# Patient Record
Sex: Male | Born: 1970 | Race: Black or African American | Hispanic: No | Smoking: Current every day smoker
Health system: Southern US, Community
[De-identification: ages and names within clinical notes are randomized; demographics above are authoritative.]

## PROBLEM LIST (undated history)

## (undated) DIAGNOSIS — E119 Type 2 diabetes mellitus without complications: Secondary | ICD-10-CM

## (undated) HISTORY — PX: CHOLECYSTECTOMY: SHX55

---

## 2005-06-10 ENCOUNTER — Ambulatory Visit: Payer: Self-pay | Admitting: Family Medicine

## 2006-12-04 ENCOUNTER — Ambulatory Visit: Payer: Self-pay | Admitting: Internal Medicine

## 2006-12-21 ENCOUNTER — Ambulatory Visit: Payer: Self-pay | Admitting: General Surgery

## 2007-07-06 ENCOUNTER — Inpatient Hospital Stay: Payer: Self-pay | Admitting: Surgery

## 2007-07-17 ENCOUNTER — Ambulatory Visit: Payer: Self-pay | Admitting: Surgery

## 2007-08-29 ENCOUNTER — Ambulatory Visit: Payer: Self-pay | Admitting: Family Medicine

## 2007-08-30 ENCOUNTER — Encounter: Payer: Self-pay | Admitting: Orthopedic Surgery

## 2007-09-01 ENCOUNTER — Encounter: Payer: Self-pay | Admitting: Orthopedic Surgery

## 2010-05-26 ENCOUNTER — Emergency Department: Payer: Self-pay | Admitting: Emergency Medicine

## 2014-02-17 ENCOUNTER — Emergency Department: Payer: Self-pay | Admitting: Emergency Medicine

## 2014-04-29 ENCOUNTER — Emergency Department: Payer: Self-pay | Admitting: Emergency Medicine

## 2014-05-22 ENCOUNTER — Emergency Department
Admit: 2014-05-22 | Disposition: A | Discharge status: Home / Self Care | Payer: Self-pay | Admitting: Emergency Medicine

## 2014-06-29 ENCOUNTER — Other Ambulatory Visit: Payer: Self-pay

## 2014-06-29 ENCOUNTER — Encounter: Payer: Self-pay | Admitting: Emergency Medicine

## 2014-06-29 ENCOUNTER — Emergency Department: Payer: Self-pay

## 2014-06-29 ENCOUNTER — Emergency Department
Admission: EM | Admit: 2014-06-29 | Discharge: 2014-06-29 | Disposition: A | Discharge status: Home / Self Care | Payer: Self-pay | Attending: Emergency Medicine | Admitting: Emergency Medicine

## 2014-06-29 DIAGNOSIS — R079 Chest pain, unspecified: Secondary | ICD-10-CM | POA: Insufficient documentation

## 2014-06-29 DIAGNOSIS — Z720 Tobacco use: Secondary | ICD-10-CM | POA: Insufficient documentation

## 2014-06-29 DIAGNOSIS — E119 Type 2 diabetes mellitus without complications: Secondary | ICD-10-CM | POA: Insufficient documentation

## 2014-06-29 HISTORY — DX: Type 2 diabetes mellitus without complications: E11.9

## 2014-06-29 LAB — BASIC METABOLIC PANEL
Anion gap: 9 (ref 5–15)
BUN: 10 mg/dL (ref 6–20)
CALCIUM: 9 mg/dL (ref 8.9–10.3)
CHLORIDE: 99 mmol/L — AB (ref 101–111)
CO2: 28 mmol/L (ref 22–32)
Creatinine, Ser: 0.81 mg/dL (ref 0.61–1.24)
GFR calc Af Amer: >60 mL/min (ref >60)
GLUCOSE: 360 mg/dL — AB (ref 65–99)
Potassium: 3.8 mmol/L (ref 3.5–5.1)
Sodium: 136 mmol/L (ref 135–145)

## 2014-06-29 LAB — CBC
HCT: 45.7 % (ref 40.0–52.0)
Hemoglobin: 15.7 g/dL (ref 13.0–18.0)
MCH: 33.3 pg (ref 26.0–34.0)
MCHC: 34.4 g/dL (ref 32.0–36.0)
MCV: 96.8 fL (ref 80.0–100.0)
Platelets: 168 10*3/uL (ref 150–440)
RBC: 4.72 MIL/uL (ref 4.40–5.90)
RDW: 12 % (ref 11.5–14.5)
WBC: 6.7 10*3/uL (ref 3.8–10.6)

## 2014-06-29 LAB — TROPONIN I: Troponin I: <0.03 ng/mL (ref <0.031)

## 2014-06-29 MED ORDER — ASPIRIN 81 MG PO CHEW
324.0000 mg | CHEWABLE_TABLET | Freq: Once | ORAL | Status: AC
  Administered 2014-06-29: 324 mg via ORAL

## 2014-06-29 MED ORDER — ASPIRIN 81 MG PO CHEW
CHEWABLE_TABLET | ORAL | Status: AC
  Administered 2014-06-29: 324 mg via ORAL
  Filled 2014-06-29: qty 4

## 2014-06-29 MED ORDER — NITROGLYCERIN 0.4 MG SL SUBL
SUBLINGUAL_TABLET | SUBLINGUAL | Status: AC
  Administered 2014-06-29: 0.4 mg via SUBLINGUAL
  Filled 2014-06-29: qty 1

## 2014-06-29 MED ORDER — NITROGLYCERIN 0.4 MG SL SUBL
0.4000 mg | SUBLINGUAL_TABLET | SUBLINGUAL | Status: DC | PRN
  Administered 2014-06-29: 0.4 mg via SUBLINGUAL

## 2014-06-29 NOTE — ED Notes (Signed)
Pt given sandwich tray and drink. 

## 2014-06-29 NOTE — Discharge Instructions (Signed)
Chest Pain (Nonspecific) °It is often hard to give a specific diagnosis for the cause of chest pain. There is always a chance that your pain could be related to something serious, such as a heart attack or a blood clot in the lungs. You need to follow up with your health care provider for further evaluation. °CAUSES  °· Heartburn. °· Pneumonia or bronchitis. °· Anxiety or stress. °· Inflammation around your heart (pericarditis) or lung (pleuritis or pleurisy). °· A blood clot in the lung. °· A collapsed lung (pneumothorax). It can develop suddenly on its own (spontaneous pneumothorax) or from trauma to the chest. °· Shingles infection (herpes zoster virus). °The chest wall is composed of bones, muscles, and cartilage. Any of these can be the source of the pain. °· The bones can be bruised by injury. °· The muscles or cartilage can be strained by coughing or overwork. °· The cartilage can be affected by inflammation and become sore (costochondritis). °DIAGNOSIS  °Lab tests or other studies may be needed to find the cause of your pain. Your health care provider may have you take a test called an ambulatory electrocardiogram (ECG). An ECG records your heartbeat patterns over a 24-hour period. You may also have other tests, such as: °· Transthoracic echocardiogram (TTE). During echocardiography, sound waves are used to evaluate how blood flows through your heart. °· Transesophageal echocardiogram (TEE). °· Cardiac monitoring. This allows your health care provider to monitor your heart rate and rhythm in real time. °· Holter monitor. This is a portable device that records your heartbeat and can help diagnose heart arrhythmias. It allows your health care provider to track your heart activity for several days, if needed. °· Stress tests by exercise or by giving medicine that makes the heart beat faster. °TREATMENT  °· Treatment depends on what may be causing your chest pain. Treatment may include: °¨ Acid blockers for  heartburn. °¨ Anti-inflammatory medicine. °¨ Pain medicine for inflammatory conditions. °¨ Antibiotics if an infection is present. °· You may be advised to change lifestyle habits. This includes stopping smoking and avoiding alcohol, caffeine, and chocolate. °· You may be advised to keep your head raised (elevated) when sleeping. This reduces the chance of acid going backward from your stomach into your esophagus. °Most of the time, nonspecific chest pain will improve within 2-3 days with rest and mild pain medicine.  °HOME CARE INSTRUCTIONS  °· If antibiotics were prescribed, take them as directed. Finish them even if you start to feel better. °· For the next few days, avoid physical activities that bring on chest pain. Continue physical activities as directed. °· Do not use any tobacco products, including cigarettes, chewing tobacco, or electronic cigarettes. °· Avoid drinking alcohol. °· Only take medicine as directed by your health care provider. °· Follow your health care provider's suggestions for further testing if your chest pain does not go away. °· Keep any follow-up appointments you made. If you do not go to an appointment, you could develop lasting (chronic) problems with pain. If there is any problem keeping an appointment, call to reschedule. °SEEK MEDICAL CARE IF:  °· Your chest pain does not go away, even after treatment. °· You have a rash with blisters on your chest. °· You have a fever. °SEEK IMMEDIATE MEDICAL CARE IF:  °· You have increased chest pain or pain that spreads to your arm, neck, jaw, back, or abdomen. °· You have shortness of breath. °· You have an increasing cough, or you cough   up blood.  You have severe back or abdominal pain.  You feel nauseous or vomit.  You have severe weakness.  You faint.  You have chills. This is an emergency. Do not wait to see if the pain will go away. Get medical help at once. Call your local emergency services (911 in [place] Do not drive  yourself to the hospital. MAKE SURE YOU:   Understand these instructions.  Will watch your condition.  Will get help right away if you are not doing well or get worse. Document Released: 10/27/2004 Document Revised: 01/22/2013 Document Reviewed: 08/23/2007 [facility] Patient Information 2015 [place], [place]. This information is not intended to replace advice given to you by your health care provider. Make sure you discuss any questions you have with your health care provider.     You have been seen in the emergency department for chest discomfort. As we have discussed your results today are within normal limits. Please follow-up with your primary care doctor says possible to discuss today's emergency department visit and your symptoms. As we have discussed we highly recommended obtaining a cardiac stress test. Please discuss this with her primary care doctor, or call the number provided for cardiology. Return to the emergency department for any further chest pain, pressure, heaviness, trouble breathing or any other personally concerning symptom.

## 2014-06-29 NOTE — ED Notes (Addendum)
Pt reports chest pain on the left side that radiated to his left arm at first but is no longer in his arm. Pt reports having SOB, Nausea and diaphoresis that he is no longer having. He hoped CP  would go away but has not.

## 2014-06-29 NOTE — ED Provider Notes (Signed)
[facility] Provider Note  Time seen: 3:37 PM  I have reviewed the triage vital signs and the nursing notes.   HISTORY  Chief Complaint Chest Pain    HPI Angel Mitchell is a 44 y.o. male with a past medical history of diabetes who presents the emergency department with 3 days of chest pain. According to the patient he has had moderate heaviness in his chest 3 days. At times he feels the pain radiating down his left arm, but not currently. Denies any nausea, diaphoresis, shortness of breath. Denies any worsening with exertion. Patient has a strong family history of heart disease with a father who passed away of a heart attack at 46 years old. Patient currently describes his chest pain as heaviness, 7/10, located mostly in the central to left chest.    Past Medical History  Diagnosis Date  . Diabetes mellitus without complication     There are no active problems to display for this patient.   Past Surgical History  Procedure Laterality Date  . Cholecystectomy      No current outpatient prescriptions on file.  Allergies Review of patient's allergies indicates no known allergies.  History reviewed. No pertinent family history.  Social History History  Substance Use Topics  . Smoking status: Current Every Day Smoker  . Smokeless tobacco: Not on file  . Alcohol Use: No    Review of Systems Constitutional: Negative for fever. Cardiovascular: Positive for chest heaviness. Respiratory: Negative for shortness of breath. Gastrointestinal: Negative for abdominal pain, nausea, or vomiting. Neurological: Negative for headache 10-point ROS otherwise negative.  ____________________________________________   PHYSICAL EXAM:  VITAL SIGNS: ED Triage Vitals  Enc Vitals Group     BP 06/29/14 1509 177/88 mmHg     Pulse Rate 06/29/14 1509 95     Resp 06/29/14 1530 19     Temp 06/29/14 1509 98.3 F (36.8 C)     Temp Source 06/29/14  1509 Oral     SpO2 06/29/14 1509 97 %     Weight 06/29/14 1507 185 lb (83.915 kg)     Height 06/29/14 1507 5\' 8"  (1.727 m)     Head Cir --      Peak Flow --      Pain Score 06/29/14 1507 6     Pain Loc --      Pain Edu? --      Excl. in GC? --     Constitutional: Alert and oriented. Well appearing and in no distress. ENT   Mouth/Throat: Mucous membranes are moist. Cardiovascular: Normal rate, regular rhythm. No murmur Respiratory: Normal respiratory effort without tachypnea nor retractions. Breath sounds are clear. Chest is nontender to palpation. Gastrointestinal: Soft and nontender.  Musculoskeletal: Nontender with normal range of motion in all extremities. No lower extremity tenderness or edema. Neurologic:  Normal speech and language. No gross focal neurologic deficits  Skin:  Skin is warm, dry and intact.  Psychiatric: Mood and affect are normal. Speech and behavior are normal.  ____________________________________________    EKG  EKG reviewed and interpreted by myself shows normal sinus rhythm at 77 bpm, narrow QRS, normal axis, normal intervals, no concerning ST changes noted.  ____________________________________________    RADIOLOGY  Normal chest x-ray  ____________________________________________   INITIAL IMPRESSION / ASSESSMENT AND PLAN / ED COURSE  Pertinent labs & imaging results that were available during my care of the patient were reviewed by me and considered in my medical decision making (see chart  for details).  Patient with 3 days of chest heaviness, strong family history but no personal history of heart disease. We will check labs, chest x-ray, and closely monitor in the emergency department. Patient currently states 7/10 chest pain, we will treat with aspirin and nitroglycerin and monitor for improvement. EKG is largely within normal limits.  Second troponin within normal limit, patient states he feels quite a bit better. I discussed with the  patient importance of following up with a cardiologist for a stress test. The patient is agreeable to this plan. I discussed strict return precautions with the patient for any further chest pain.  ____________________________________________   FINAL CLINICAL IMPRESSION(S) / ED DIAGNOSES   Chest pain    Minna Antis, MD 06/29/14 2041

## 2014-08-15 ENCOUNTER — Encounter: Payer: Self-pay | Admitting: Emergency Medicine

## 2014-08-15 ENCOUNTER — Emergency Department
Admission: EM | Admit: 2014-08-15 | Discharge: 2014-08-15 | Disposition: A | Discharge status: Home / Self Care | Payer: Self-pay | Attending: Emergency Medicine | Admitting: Emergency Medicine

## 2014-08-15 DIAGNOSIS — K529 Noninfective gastroenteritis and colitis, unspecified: Secondary | ICD-10-CM | POA: Insufficient documentation

## 2014-08-15 DIAGNOSIS — Z720 Tobacco use: Secondary | ICD-10-CM | POA: Insufficient documentation

## 2014-08-15 DIAGNOSIS — E119 Type 2 diabetes mellitus without complications: Secondary | ICD-10-CM | POA: Insufficient documentation

## 2014-08-15 LAB — COMPREHENSIVE METABOLIC PANEL
ALK PHOS: 97 U/L (ref 38–126)
ALT: 22 U/L (ref 17–63)
AST: 24 U/L (ref 15–41)
Albumin: 4.3 g/dL (ref 3.5–5.0)
Anion gap: 7 (ref 5–15)
BILIRUBIN TOTAL: 1.4 mg/dL — AB (ref 0.3–1.2)
BUN: 8 mg/dL (ref 6–20)
CO2: 27 mmol/L (ref 22–32)
Calcium: 8.7 mg/dL — ABNORMAL LOW (ref 8.9–10.3)
Chloride: 100 mmol/L — ABNORMAL LOW (ref 101–111)
Creatinine, Ser: 0.76 mg/dL (ref 0.61–1.24)
GFR calc non Af Amer: >60 mL/min (ref >60)
GLUCOSE: 251 mg/dL — AB (ref 65–99)
Potassium: 3.5 mmol/L (ref 3.5–5.1)
Sodium: 134 mmol/L — ABNORMAL LOW (ref 135–145)
Total Protein: 7.5 g/dL (ref 6.5–8.1)

## 2014-08-15 LAB — CBC
HCT: 46 % (ref 40.0–52.0)
Hemoglobin: 16 g/dL (ref 13.0–18.0)
MCH: 34.4 pg — ABNORMAL HIGH (ref 26.0–34.0)
MCHC: 34.9 g/dL (ref 32.0–36.0)
MCV: 98.7 fL (ref 80.0–100.0)
PLATELETS: 199 10*3/uL (ref 150–440)
RBC: 4.66 MIL/uL (ref 4.40–5.90)
RDW: 12.4 % (ref 11.5–14.5)
WBC: 5.3 10*3/uL (ref 3.8–10.6)

## 2014-08-15 LAB — LIPASE, BLOOD: Lipase: 25 U/L (ref 22–51)

## 2014-08-15 MED ORDER — METRONIDAZOLE 500 MG PO TABS: 500.0000 mg | ORAL_TABLET | Freq: Two times a day (BID) | ORAL | Status: DC

## 2014-08-15 MED ORDER — CIPROFLOXACIN HCL 500 MG PO TABS: 500.0000 mg | ORAL_TABLET | Freq: Two times a day (BID) | ORAL | Status: DC

## 2014-08-15 MED ORDER — ONDANSETRON HCL 4 MG/2ML IJ SOLN
INTRAMUSCULAR | Status: AC
  Administered 2014-08-15: 4 mg via INTRAVENOUS
  Filled 2014-08-15: qty 2

## 2014-08-15 MED ORDER — ONDANSETRON 4 MG PO TBDP
ORAL_TABLET | ORAL | Status: AC
  Filled 2014-08-15: qty 1

## 2014-08-15 MED ORDER — ONDANSETRON HCL 4 MG/2ML IJ SOLN
4.0000 mg | Freq: Once | INTRAMUSCULAR | Status: AC
  Administered 2014-08-15: 4 mg via INTRAVENOUS

## 2014-08-15 MED ORDER — ONDANSETRON 4 MG PO TBDP
4.0000 mg | ORAL_TABLET | Freq: Once | ORAL | Status: AC | PRN
  Administered 2014-08-15: 4 mg via ORAL

## 2014-08-15 MED ORDER — SODIUM CHLORIDE 0.9 % IV BOLUS (SEPSIS)
1000.0000 mL | Freq: Once | INTRAVENOUS | Status: AC
  Administered 2014-08-15: 1000 mL via INTRAVENOUS

## 2014-08-15 NOTE — ED Provider Notes (Signed)
[facility] Emergency Department Provider Note  ____________________________________________  Time seen: On arrival  I have reviewed the triage vital signs and the nursing notes.   HISTORY  Chief Complaint Emesis; Diarrhea; and Abdominal Pain    HPI Angel Mitchell is a 44 y.o. male who complains of nausea vomiting and diarrhea for 2 weeks. Patient reports he has had cramping abdominal pain and seems to have diarrhea after everything he eats. He also complains of nausea made worse by eating. Currently he has no abdominal pain. No fevers no chills. No blood in the stool or vomit. No sick contacts. No recent travel. No recent camping. He has a history of diabetes     Past Medical History  Diagnosis Date  . Diabetes mellitus without complication     There are no active problems to display for this patient.   Past Surgical History  Procedure Laterality Date  . Cholecystectomy      Current Outpatient Rx  Name  Route  Sig  Dispense  Refill  . metFORMIN (GLUCOPHAGE) 500 MG tablet   Oral   Take 500 mg by mouth 2 (two) times daily with a meal.           Allergies Review of patient's allergies indicates no known allergies.  No family history on file.  Social History History  Substance Use Topics  . Smoking status: Current Some Day Smoker    Types: Cigarettes  . Smokeless tobacco: Not on file  . Alcohol Use: No    Review of Systems  Constitutional: Negative for fever. Eyes: Negative for visual changes. ENT: Negative for sore throat Cardiovascular: Negative for chest pain. Respiratory: Negative for shortness of breath. Gastrointestinal: Positive for vomiting and diarrhea Genitourinary: Negative for dysuria. Musculoskeletal: Negative for back pain. Skin: Negative for rash. Neurological: Negative for headaches Psychiatric: No anxiety  10-point ROS otherwise negative.  ____________________________________________   PHYSICAL  EXAM:  VITAL SIGNS: ED Triage Vitals  Enc Vitals Group     BP 08/15/14 1312 173/105 mmHg     Pulse Rate 08/15/14 1312 84     Resp 08/15/14 1312 16     Temp 08/15/14 1312 98.5 F (36.9 C)     Temp Source 08/15/14 1312 Oral     SpO2 08/15/14 1312 99 %     Weight 08/15/14 1312 180 lb (81.647 kg)     Height 08/15/14 1312  (1.727 m)     Head Cir --      Peak Flow --      Pain Score 08/15/14 1312 7     Pain Loc --      Pain Edu? --      Excl. in GC? --      Constitutional: Alert and oriented. Well appearing and in no distress. Eyes: Conjunctivae are normal.  ENT   Head: Normocephalic and atraumatic.   Mouth/Throat: Mucous membranes are moist. Cardiovascular: Normal rate, regular rhythm. Normal and symmetric distal pulses are present in all extremities. No murmurs, rubs, or gallops. Respiratory: Normal respiratory effort without tachypnea nor retractions. Breath sounds are clear and equal bilaterally.  Gastrointestinal: Soft and non-tender in all quadrants. No distention. There is no CVA tenderness. Genitourinary: deferred Musculoskeletal: Nontender with normal range of motion in all extremities. No lower extremity tenderness nor edema. Neurologic:  Normal speech and language. No gross focal neurologic deficits are appreciated. Skin:  Skin is warm, dry and intact. No rash noted. Psychiatric: Mood and affect are normal. Patient exhibits appropriate  insight and judgment.  ____________________________________________    LABS (pertinent positives/negatives)  Labs Reviewed  COMPREHENSIVE METABOLIC PANEL - Abnormal; Notable for the following:    Sodium 134 (*)    Chloride 100 (*)    Glucose, Bld 251 (*)    Calcium 8.7 (*)    Total Bilirubin 1.4 (*)    All other components within normal limits  CBC - Abnormal; Notable for the following:    MCH 34.4 (*)    All other components within normal limits  LIPASE, BLOOD  URINALYSIS COMPLETEWITH MICROSCOPIC ([facility] ONLY)     ____________________________________________   EKG  None   ____________________________________________    RADIOLOGY I have personally reviewed any xrays that were ordered on this patient: None ____________________________________________   PROCEDURES  Procedure(s) performed: none  Critical Care performed: none  ____________________________________________   INITIAL IMPRESSION / ASSESSMENT AND PLAN / ED COURSE  Pertinent labs & imaging results that were available during my care of the patient were reviewed by me and considered in my medical decision making (see chart for details).  Patient overall well-appearing, no acute distress. Labs consistent with hyponatremia and mild hyperglycemia but otherwise unremarkable. No white blood cell count. Suspect colitis of unknown origin. We will try Cipro Flagyl Rx and give normal saline and Zofran IV in the emergency department. Follow-up with PCP.  ____________________________________________   FINAL CLINICAL IMPRESSION(S) / ED DIAGNOSES  Final diagnoses:  Colitis     Jene Everyobert Vi Whitesel, MD 08/15/14 1505

## 2014-08-15 NOTE — ED Notes (Signed)
Pt reports vomiting and diarrhea x2 weeks; report lower abdominal pain.

## 2014-08-15 NOTE — Discharge Instructions (Signed)

## 2015-02-10 ENCOUNTER — Encounter: Payer: Self-pay | Admitting: Emergency Medicine

## 2015-02-10 ENCOUNTER — Emergency Department: Payer: Self-pay

## 2015-02-10 ENCOUNTER — Emergency Department
Admission: EM | Admit: 2015-02-10 | Discharge: 2015-02-10 | Disposition: A | Discharge status: Home / Self Care | Payer: Self-pay | Attending: Emergency Medicine | Admitting: Emergency Medicine

## 2015-02-10 DIAGNOSIS — R1030 Lower abdominal pain, unspecified: Secondary | ICD-10-CM | POA: Insufficient documentation

## 2015-02-10 DIAGNOSIS — Z792 Long term (current) use of antibiotics: Secondary | ICD-10-CM | POA: Insufficient documentation

## 2015-02-10 DIAGNOSIS — R112 Nausea with vomiting, unspecified: Secondary | ICD-10-CM | POA: Insufficient documentation

## 2015-02-10 DIAGNOSIS — E119 Type 2 diabetes mellitus without complications: Secondary | ICD-10-CM | POA: Insufficient documentation

## 2015-02-10 DIAGNOSIS — Z7984 Long term (current) use of oral hypoglycemic drugs: Secondary | ICD-10-CM | POA: Insufficient documentation

## 2015-02-10 DIAGNOSIS — R197 Diarrhea, unspecified: Secondary | ICD-10-CM | POA: Insufficient documentation

## 2015-02-10 DIAGNOSIS — F17210 Nicotine dependence, cigarettes, uncomplicated: Secondary | ICD-10-CM | POA: Insufficient documentation

## 2015-02-10 LAB — URINALYSIS COMPLETE WITH MICROSCOPIC (ARMC ONLY)
Bacteria, UA: NONE SEEN
Bilirubin Urine: NEGATIVE
Glucose, UA: >500 mg/dL — AB
Hgb urine dipstick: NEGATIVE
Leukocytes, UA: NEGATIVE
Nitrite: NEGATIVE
PROTEIN: 30 mg/dL — AB
Specific Gravity, Urine: 1.036 — ABNORMAL HIGH (ref 1.005–1.030)
Squamous Epithelial / LPF: NONE SEEN
pH: 5 (ref 5.0–8.0)

## 2015-02-10 LAB — COMPREHENSIVE METABOLIC PANEL
ALK PHOS: 104 U/L (ref 38–126)
ALT: 16 U/L — ABNORMAL LOW (ref 17–63)
AST: 13 U/L — ABNORMAL LOW (ref 15–41)
Albumin: 4 g/dL (ref 3.5–5.0)
Anion gap: 4 — ABNORMAL LOW (ref 5–15)
BUN: 9 mg/dL (ref 6–20)
CALCIUM: 8.7 mg/dL — AB (ref 8.9–10.3)
CO2: 26 mmol/L (ref 22–32)
CREATININE: 0.73 mg/dL (ref 0.61–1.24)
Chloride: 106 mmol/L (ref 101–111)
Glucose, Bld: 266 mg/dL — ABNORMAL HIGH (ref 65–99)
Potassium: 3.8 mmol/L (ref 3.5–5.1)
Sodium: 136 mmol/L (ref 135–145)
Total Bilirubin: 1 mg/dL (ref 0.3–1.2)
Total Protein: 7.3 g/dL (ref 6.5–8.1)

## 2015-02-10 LAB — CBC
HCT: 47.9 % (ref 40.0–52.0)
HEMOGLOBIN: 16.2 g/dL (ref 13.0–18.0)
MCH: 32.6 pg (ref 26.0–34.0)
MCHC: 33.9 g/dL (ref 32.0–36.0)
MCV: 96.2 fL (ref 80.0–100.0)
Platelets: 203 10*3/uL (ref 150–440)
RBC: 4.97 MIL/uL (ref 4.40–5.90)
RDW: 12.1 % (ref 11.5–14.5)
WBC: 5.6 10*3/uL (ref 3.8–10.6)

## 2015-02-10 LAB — LIPASE, BLOOD: LIPASE: 39 U/L (ref 11–51)

## 2015-02-10 LAB — GLUCOSE, CAPILLARY: GLUCOSE-CAPILLARY: 268 mg/dL — AB (ref 65–99)

## 2015-02-10 MED ORDER — ONDANSETRON 4 MG PO TBDP: 4.0000 mg | ORAL_TABLET | Freq: Three times a day (TID) | ORAL | Status: AC | PRN

## 2015-02-10 MED ORDER — MORPHINE SULFATE (PF) 4 MG/ML IV SOLN: 4.0000 mg | Freq: Once | INTRAVENOUS | Status: AC

## 2015-02-10 MED ORDER — ONDANSETRON HCL 4 MG/2ML IJ SOLN: 4.0000 mg | Freq: Once | INTRAMUSCULAR | Status: AC

## 2015-02-10 MED ORDER — TRAMADOL HCL 50 MG PO TABS: 50.0000 mg | ORAL_TABLET | Freq: Four times a day (QID) | ORAL | Status: AC | PRN

## 2015-02-10 MED ORDER — IOHEXOL 240 MG/ML SOLN: 25.0000 mL | Freq: Once | INTRAMUSCULAR | Status: DC | PRN

## 2015-02-10 MED ORDER — IOHEXOL 300 MG/ML  SOLN
100.0000 mL | Freq: Once | INTRAMUSCULAR | Status: AC | PRN
  Administered 2015-02-10: 100 mL via INTRAVENOUS

## 2015-02-10 MED ORDER — MORPHINE SULFATE (PF) 4 MG/ML IV SOLN
INTRAVENOUS | Status: AC
  Administered 2015-02-10: 4 mg
  Filled 2015-02-10: qty 1

## 2015-02-10 MED ORDER — ONDANSETRON HCL 4 MG/2ML IJ SOLN
INTRAMUSCULAR | Status: AC
  Administered 2015-02-10: 4 mg
  Filled 2015-02-10: qty 2

## 2015-02-10 MED ORDER — IOHEXOL 240 MG/ML SOLN
25.0000 mL | INTRAMUSCULAR | Status: AC
  Administered 2015-02-10 (×2): 25 mL via ORAL

## 2015-02-10 NOTE — ED Provider Notes (Signed)
[facility] Emergency Department Provider Note  Time seen: 8:52 AM  I have reviewed the triage vital signs and the nursing notes.   HISTORY  Chief Complaint Abdominal Pain    HPI Angel Mitchell is a 45 y.o. male with a past medical history of diabetes who presents the emergency department 3 days of nausea, vomiting, diarrhea and abdominal cramping. According to the patient beginning 3 days ago he has been very nauseated, unable to keep down much food or fluids due to vomiting. He is also developed intermittent diarrhea and lower abdominal pain/cramping. States he was diagnosed with colitis several months ago with similar symptoms and was concerned that he could once again be developing colitis so he came to the emergency department for evaluation. Describes his abdominal pain is mild to moderate.     Past Medical History  Diagnosis Date  . Diabetes mellitus without complication (HCC)     There are no active problems to display for this patient.   Past Surgical History  Procedure Laterality Date  . Cholecystectomy      Current Outpatient Rx  Name  Route  Sig  Dispense  Refill  . ciprofloxacin (CIPRO) 500 MG tablet   Oral   Take 1 tablet (500 mg total) by mouth 2 (two) times daily.   14 tablet   0   . metFORMIN (GLUCOPHAGE) 500 MG tablet   Oral   Take 500 mg by mouth 2 (two) times daily with a meal.         . metroNIDAZOLE (FLAGYL) 500 MG tablet   Oral   Take 1 tablet (500 mg total) by mouth 2 (two) times daily after a meal.   14 tablet   0     Allergies Review of patient's allergies indicates no known allergies.  No family history on file.  Social History Social History  Substance Use Topics  . Smoking status: Current Some Day Smoker -- 0.50 packs/day    Types: Cigarettes  . Smokeless tobacco: None  . Alcohol Use: 1.2 oz/week    2 Cans of beer per week     Comment: daily    Review of Systems Constitutional: Negative for  fever. Cardiovascular: Negative for chest pain. Respiratory: Negative for shortness of breath. Gastrointestinal: Positive for lower abdominal cramping. Positive for nausea, vomiting, diarrhea. Neurological: Negative for headaches, focal weaknes 10-point ROS otherwise negative.  ____________________________________________   PHYSICAL EXAM:  VITAL SIGNS: ED Triage Vitals  Enc Vitals Group     BP 02/10/15 0833 177/101 mmHg     Pulse Rate 02/10/15 0833 81     Resp 02/10/15 0833 16     Temp 02/10/15 0833 97.5 F (36.4 C)     Temp Source 02/10/15 0833 Oral     SpO2 02/10/15 0833 98 %     Weight 02/10/15 0833 180 lb (81.647 kg)     Height 02/10/15 0833 5\' 8"  (1.727 m)     Head Cir --      Peak Flow --      Pain Score 02/10/15 0834 8     Pain Loc --      Pain Edu? --      Excl. in GC? --     Constitutional: Alert and oriented. Well appearing and in no distress. Eyes: Normal exam ENT   Head: Normocephalic and atraumatic.   Mouth/Throat: Mucous membranes are moist. Cardiovascular: Normal rate, regular rhythm. No murmur Respiratory: Normal respiratory effort without tachypnea nor retractions. Breath sounds  are clear and equal bilaterally. No wheezes/rales/rhonchi. Gastrointestinal: Soft, mild lower abdominal tenderness to palpation. No rebound or guarding. No distention. Normal bowel sounds. Musculoskeletal: Nontender with normal range of motion in all extremities.  Neurologic:  Normal speech and language. No gross focal neurologic deficits Skin:  Skin is warm, dry and intact.  Psychiatric: Mood and affect are normal. Speech and behavior are normal.   ____________________________________________   RADIOLOGY  CT shows no acute abnormality  ____________________________________________    INITIAL IMPRESSION / ASSESSMENT AND PLAN / ED COURSE  Pertinent labs & imaging results that were available during my care of the patient were reviewed by me and considered in my  medical decision making (see chart for details).  Patient presents with 3 days of nausea, vomiting, diarrhea. Patient states lower abdominal pain/cramping as well. Denies fever. Afebrile in the emergency department with largely normal vitals. Mild tenderness to palpation in the lower abdomen. We will check labs, IV hydrate, and treat nausea and closely monitor in the emergency department.  CT negative. We'll discharge the patient was Zofran and a short course of Ultram. Discussed supportive care at home patient agreeable to plan.  ____________________________________________   FINAL CLINICAL IMPRESSION(S) / ED DIAGNOSES  Nausea, vomiting, diarrhea   Minna Antis, MD 02/10/15 1319

## 2015-02-10 NOTE — ED Notes (Signed)
FSBS 268

## 2015-02-10 NOTE — ED Notes (Signed)
Symptoms: Nausea, vomiting, diarrhea, stomach cramping Started 3 days ago, here today because of no improvement Patient states he has had the stomach virus before and should feel better by now. Has a history of colitis. Alert and oriented.

## 2015-02-10 NOTE — Discharge Instructions (Signed)
Abdominal Pain, Adult °Many things can cause abdominal pain. Usually, abdominal pain is not caused by a disease and will improve without treatment. It can often be observed and treated at home. Your health care provider will do a physical exam and possibly order blood tests and X-rays to help determine the seriousness of your pain. However, in many cases, more time must pass before a clear cause of the pain can be found. Before that point, your health care provider may not know if you need more testing or further treatment. °HOME CARE INSTRUCTIONS °Monitor your abdominal pain for any changes. The following actions may help to alleviate any discomfort you are experiencing: °· Only take over-the-counter or prescription medicines as directed by your health care provider. °· Do not take laxatives unless directed to do so by your health care provider. °· Try a clear liquid diet (broth, tea, or water) as directed by your health care provider. Slowly move to a bland diet as tolerated. °SEEK MEDICAL CARE IF: °· You have unexplained abdominal pain. °· You have abdominal pain associated with nausea or diarrhea. °· You have pain when you urinate or have a bowel movement. °· You experience abdominal pain that wakes you in the night. °· You have abdominal pain that is worsened or improved by eating food. °· You have abdominal pain that is worsened with eating fatty foods. °· You have a fever. °SEEK IMMEDIATE MEDICAL CARE IF: °· Your pain does not go away within 2 hours. °· You keep throwing up (vomiting). °· Your pain is felt only in portions of the abdomen, such as the right side or the left lower portion of the abdomen. °· You pass bloody or black tarry stools. °MAKE SURE YOU: °· Understand these instructions. °· Will watch your condition. °· Will get help right away if you are not doing well or get worse. °  °This information is not intended to replace advice given to you by your health care provider. Make sure you discuss  any questions you have with your health care provider. °  °Document Released: 10/27/2004 Document Revised: 10/08/2014 Document Reviewed: 09/26/2012 °Elsevier Interactive Patient Education ©2016 [employer] ° °Nausea and Vomiting °Nausea means you feel sick to your stomach. Throwing up (vomiting) is a reflex where stomach contents come out of your mouth. °HOME CARE  °· Take medicine as told by your doctor. °· Do not force yourself to eat. However, you do need to drink fluids. °· If you feel like eating, eat a normal diet as told by your doctor. °¨ Eat rice, wheat, potatoes, bread, lean meats, yogurt, fruits, and vegetables. °¨ Avoid high-fat foods. °· Drink enough fluids to keep your pee (urine) clear or pale yellow. °· Ask your doctor how to replace body fluid losses (rehydrate). Signs of body fluid loss (dehydration) include: °¨ Feeling very thirsty. °¨ Dry lips and mouth. °¨ Feeling dizzy. °¨ Dark pee. °¨ Peeing less than normal. °¨ Feeling confused. °¨ Fast breathing or heart rate. °GET HELP RIGHT AWAY IF:  °· You have blood in your throw up. °· You have black or bloody poop (stool). °· You have a bad headache or stiff neck. °· You feel confused. °· You have bad belly (abdominal) pain. °· You have chest pain or trouble breathing. °· You do not pee at least once every 8 hours. °· You have cold, clammy skin. °· You keep throwing up after 24 to 48 hours. °· You have a fever. °MAKE SURE YOU:  °·   Understand these instructions. °· Will watch your condition. °· Will get help right away if you are not doing well or get worse. °  °This information is not intended to replace advice given to you by your health care provider. Make sure you discuss any questions you have with your health care provider. °  °Document Released: 07/06/2007 Document Revised: 04/11/2011 Document Reviewed: 06/18/2010 °Elsevier Interactive Patient Education ©2016 [employer] Inc[employer] ° °

## 2015-02-10 NOTE — ED Notes (Signed)
Patient to Room 1 with paramedic student. Report called to [employer]epsiCoCollyn Gillispie.

## 2015-02-10 NOTE — ED Notes (Signed)
Pt c/o abd cramping with N/V/D for the past 3 days..Marland Kitchen

## 2015-04-21 ENCOUNTER — Encounter: Payer: Self-pay | Admitting: *Deleted

## 2015-04-21 ENCOUNTER — Emergency Department: Payer: Self-pay

## 2015-04-21 DIAGNOSIS — E119 Type 2 diabetes mellitus without complications: Secondary | ICD-10-CM | POA: Insufficient documentation

## 2015-04-21 DIAGNOSIS — F439 Reaction to severe stress, unspecified: Secondary | ICD-10-CM | POA: Insufficient documentation

## 2015-04-21 DIAGNOSIS — Z7984 Long term (current) use of oral hypoglycemic drugs: Secondary | ICD-10-CM | POA: Insufficient documentation

## 2015-04-21 DIAGNOSIS — R0602 Shortness of breath: Secondary | ICD-10-CM | POA: Insufficient documentation

## 2015-04-21 DIAGNOSIS — F17210 Nicotine dependence, cigarettes, uncomplicated: Secondary | ICD-10-CM | POA: Insufficient documentation

## 2015-04-21 DIAGNOSIS — R079 Chest pain, unspecified: Secondary | ICD-10-CM | POA: Insufficient documentation

## 2015-04-21 LAB — CBC
HEMATOCRIT: 50.5 % (ref 40.0–52.0)
HEMOGLOBIN: 17.5 g/dL (ref 13.0–18.0)
MCH: 33.2 pg (ref 26.0–34.0)
MCHC: 34.6 g/dL (ref 32.0–36.0)
MCV: 96.2 fL (ref 80.0–100.0)
Platelets: 228 10*3/uL (ref 150–440)
RBC: 5.25 MIL/uL (ref 4.40–5.90)
RDW: 12.5 % (ref 11.5–14.5)
WBC: 7.5 10*3/uL (ref 3.8–10.6)

## 2015-04-21 LAB — BASIC METABOLIC PANEL
ANION GAP: 8 (ref 5–15)
BUN: 8 mg/dL (ref 6–20)
CHLORIDE: 100 mmol/L — AB (ref 101–111)
CO2: 27 mmol/L (ref 22–32)
Calcium: 9 mg/dL (ref 8.9–10.3)
Creatinine, Ser: 0.63 mg/dL (ref 0.61–1.24)
Glucose, Bld: 258 mg/dL — ABNORMAL HIGH (ref 65–99)
Potassium: 3.3 mmol/L — ABNORMAL LOW (ref 3.5–5.1)
SODIUM: 135 mmol/L (ref 135–145)

## 2015-04-21 LAB — TROPONIN I

## 2015-04-21 NOTE — ED Notes (Signed)
Pt reports chest pain since yesterday.  Pt reports left side pain in chest with intermittent sob.  cig smoker.  No cough.  States pain radiates into left axilla. No n/v/d.  Pt alert.

## 2015-04-22 ENCOUNTER — Emergency Department
Admission: EM | Admit: 2015-04-22 | Discharge: 2015-04-22 | Disposition: A | Discharge status: Home / Self Care | Payer: Self-pay | Attending: Emergency Medicine | Admitting: Emergency Medicine

## 2015-04-22 DIAGNOSIS — R079 Chest pain, unspecified: Secondary | ICD-10-CM

## 2015-04-22 LAB — TROPONIN I

## 2015-04-22 NOTE — ED Provider Notes (Signed)
[facility] Emergency Department Provider Note  ____________________________________________  Time seen: 3:00 AM  I have reviewed the triage vital signs and the nursing notes.   HISTORY  Chief Complaint Chest Pain      HPI Angel Mitchell is a 45 y.o. male presents with  left-sided chest pain with radiation to the left axilla intermittently accompanied by mild dyspnea since yesterday. Patient denies any lower extremity pain or swelling no history DVT or PE. Patient states that "he is under a lot of stress right now". Patient admits to EtOH ingestion yesterday. Patient admits to a family history of coronary artery disease     Past Medical History  Diagnosis Date  . Diabetes mellitus without complication (HCC)     There are no active problems to display for this patient.   Past Surgical History  Procedure Laterality Date  . Cholecystectomy      Current Outpatient Rx  Name  Route  Sig  Dispense  Refill  . metFORMIN (GLUCOPHAGE) 500 MG tablet   Oral   Take 500 mg by mouth 2 (two) times daily with a meal.         . ondansetron (ZOFRAN ODT) 4 MG disintegrating tablet   Oral   Take 1 tablet (4 mg total) by mouth every 8 (eight) hours as needed for nausea or vomiting. Patient not taking: Reported on 04/22/2015   20 tablet   0   . traMADol (ULTRAM) 50 MG tablet   Oral   Take 1 tablet (50 mg total) by mouth every 6 (six) hours as needed. Patient not taking: Reported on 04/22/2015   10 tablet   0     Allergies No known drug allergies No family history on file.  Social History Social History  Substance Use Topics  . Smoking status: Current Some Day Smoker -- 0.50 packs/day    Types: Cigarettes  . Smokeless tobacco: None  . Alcohol Use: 1.2 oz/week    2 Cans of beer per week     Comment: daily    Review of Systems  Constitutional: Negative for fever. Eyes: Negative for visual changes. ENT: Negative for sore throat. Cardiovascular:  Positive for chest pain. Respiratory: Positive for shortness of breath. Gastrointestinal: Negative for abdominal pain, vomiting and diarrhea. Genitourinary: Negative for dysuria. Musculoskeletal: Negative for back pain. Skin: Negative for rash. Neurological: Negative for headaches, focal weakness or numbness.   10-point ROS otherwise negative.  ____________________________________________   PHYSICAL EXAM:  VITAL SIGNS: ED Triage Vitals  Enc Vitals Group     BP 04/21/15 2235 180/72 mmHg     Pulse Rate 04/21/15 2235 67     Resp 04/21/15 2235 18     Temp 04/21/15 2235 98.4 F (36.9 C)     Temp Source 04/21/15 2235 Oral     SpO2 04/21/15 2235 100 %     Weight 04/21/15 2233 180 lb (81.647 kg)     Height 04/21/15 2233 5\' 8"  (1.727 m)     Head Cir --      Peak Flow --      Pain Score 04/21/15 2234 6     Pain Loc --      Pain Edu? --      Excl. in GC? --      Constitutional: Alert and oriented. Well appearing and in no distress. Eyes: Conjunctivae are normal. PERRL. Normal extraocular movements. ENT   Head: Normocephalic and atraumatic.   Nose: No congestion/rhinnorhea.   Mouth/Throat: Mucous membranes  are moist.   Neck: No stridor. Hematological/Lymphatic/Immunilogical: No cervical lymphadenopathy. Cardiovascular: Normal rate, regular rhythm. Normal and symmetric distal pulses are present in all extremities. No murmurs, rubs, or gallops. Respiratory: Normal respiratory effort without tachypnea nor retractions. Breath sounds are clear and equal bilaterally. No wheezes/rales/rhonchi. Gastrointestinal: Soft and nontender. No distention. There is no CVA tenderness. Genitourinary: deferred Musculoskeletal: Nontender with normal range of motion in all extremities. No joint effusions.  No lower extremity tenderness nor edema. Neurologic:  Normal speech and language. No gross focal neurologic deficits are appreciated. Speech is normal.  Skin:  Skin is warm, dry and  intact. No rash noted. Psychiatric: Mood and affect are normal. Speech and behavior are normal. Patient exhibits appropriate insight and judgment.  ____________________________________________    LABS (pertinent positives/negatives)  Labs Reviewed  BASIC METABOLIC PANEL - Abnormal; Notable for the following:    Potassium 3.3 (*)    Chloride 100 (*)    Glucose, Bld 258 (*)    All other components within normal limits  TROPONIN I  CBC  TROPONIN I     ____________________________________________   EKG  ED ECG REPORT I, Murrieta N BROWN, the attending physician, personally viewed and interpreted this ECG.   Date: 04/22/2015  EKG Time: 30 5 PM  Rate: 67  Rhythm: Normal sinus rhythm with possible LVH  Axis: Normal  Intervals: Normal  ST&T Change: None   ____________________________________________    RADIOLOGY DG Chest 2 View (Final result) Result time: 04/21/15 23:57:08   Final result by Rad Results In Interface (04/21/15 23:57:08)   Narrative:   CLINICAL DATA: 45 year old male with chest pain the  EXAM: CHEST 2 VIEW  COMPARISON: Radiograph dated 06/29/2014 in  FINDINGS: The heart size and mediastinal contours are within normal limits. Both lungs are clear. The visualized skeletal structures are unremarkable.  IMPRESSION: No active cardiopulmonary disease.   Electronically Signed By: Elgie Collard M.D. On: 04/21/2015 23:57          INITIAL IMPRESSION / ASSESSMENT AND PLAN / ED COURSE  Pertinent labs & imaging results that were available during my care of the patient were reviewed by me and considered in my medical decision making (see chart for details).    ____________________________________________   FINAL CLINICAL IMPRESSION(S) / ED DIAGNOSES  Final diagnoses:  Chest pain, unspecified chest pain type      Darci Current, MD 04/24/15 2359

## 2015-04-22 NOTE — Discharge Instructions (Signed)
Nonspecific Chest Pain  °Chest pain can be caused by many different conditions. There is always a chance that your pain could be related to something serious, such as a heart attack or a blood clot in your lungs. Chest pain can also be caused by conditions that are not life-threatening. If you have chest pain, it is very important to follow up with your health care provider. °CAUSES  °Chest pain can be caused by: °· Heartburn. °· Pneumonia or bronchitis. °· Anxiety or stress. °· Inflammation around your heart (pericarditis) or lung (pleuritis or pleurisy). °· A blood clot in your lung. °· A collapsed lung (pneumothorax). It can develop suddenly on its own (spontaneous pneumothorax) or from trauma to the chest. °· Shingles infection (varicella-zoster virus). °· Heart attack. °· Damage to the bones, muscles, and cartilage that make up your chest wall. This can include: °¨ Bruised bones due to injury. °¨ Strained muscles or cartilage due to frequent or repeated coughing or overwork. °¨ Fracture to one or more ribs. °¨ Sore cartilage due to inflammation (costochondritis). °RISK FACTORS  °Risk factors for chest pain may include: °· Activities that increase your risk for trauma or injury to your chest. °· Respiratory infections or conditions that cause frequent coughing. °· Medical conditions or overeating that can cause heartburn. °· Heart disease or family history of heart disease. °· Conditions or health behaviors that increase your risk of developing a blood clot. °· Having had chicken pox (varicella zoster). °SIGNS AND SYMPTOMS °Chest pain can feel like: °· Burning or tingling on the surface of your chest or deep in your chest. °· Crushing, pressure, aching, or squeezing pain. °· Dull or sharp pain that is worse when you move, cough, or take a deep breath. °· Pain that is also felt in your back, neck, shoulder, or arm, or pain that spreads to any of these areas. °Your chest pain may come and go, or it may stay  constant. °DIAGNOSIS °Lab tests or other studies may be needed to find the cause of your pain. Your health care provider may have you take a test called an ambulatory ECG (electrocardiogram). An ECG records your heartbeat patterns at the time the test is performed. You may also have other tests, such as: °· Transthoracic echocardiogram (TTE). During echocardiography, sound waves are used to create a picture of all of the heart structures and to look at how blood flows through your heart. °· Transesophageal echocardiogram (TEE). This is a more advanced imaging test that obtains images from inside your body. It allows your health care provider to see your heart in finer detail. °· Cardiac monitoring. This allows your health care provider to monitor your heart rate and rhythm in real time. °· Holter monitor. This is a portable device that records your heartbeat and can help to diagnose abnormal heartbeats. It allows your health care provider to track your heart activity for several days, if needed. °· Stress tests. These can be done through exercise or by taking medicine that makes your heart beat more quickly. °· Blood tests. °· Imaging tests. °TREATMENT  °Your treatment depends on what is causing your chest pain. Treatment may include: °· Medicines. These may include: °¨ Acid blockers for heartburn. °¨ Anti-inflammatory medicine. °¨ Pain medicine for inflammatory conditions. °¨ Antibiotic medicine, if an infection is present. °¨ Medicines to dissolve blood clots. °¨ Medicines to treat coronary artery disease. °· Supportive care for conditions that do not require medicines. This may include: °¨ Resting. °¨ Applying heat   or cold packs to injured areas. °¨ Limiting activities until pain decreases. °HOME CARE INSTRUCTIONS °· If you were prescribed an antibiotic medicine, finish it all even if you start to feel better. °· Avoid any activities that bring on chest pain. °· Do not use any tobacco products, including  cigarettes, chewing tobacco, or electronic cigarettes. If you need help quitting, ask your health care provider. °· Do not drink alcohol. °· Take medicines only as directed by your health care provider. °· Keep all follow-up visits as directed by your health care provider. This is important. This includes any further testing if your chest pain does not go away. °· If heartburn is the cause for your chest pain, you may be told to keep your head raised (elevated) while sleeping. This reduces the chance that acid will go from your stomach into your esophagus. °· Make lifestyle changes as directed by your health care provider. These may include: °¨ Getting regular exercise. Ask your health care provider to suggest some activities that are safe for you. °¨ Eating a heart-healthy diet. A registered dietitian can help you to learn healthy eating options. °¨ Maintaining a healthy weight. °¨ Managing diabetes, if necessary. °¨ Reducing stress. °SEEK MEDICAL CARE IF: °· Your chest pain does not go away after treatment. °· You have a rash with blisters on your chest. °· You have a fever. °SEEK IMMEDIATE MEDICAL CARE IF:  °· Your chest pain is worse. °· You have an increasing cough, or you cough up blood. °· You have severe abdominal pain. °· You have severe weakness. °· You faint. °· You have chills. °· You have sudden, unexplained chest discomfort. °· You have sudden, unexplained discomfort in your arms, back, neck, or jaw. °· You have shortness of breath at any time. °· You suddenly start to sweat, or your skin gets clammy. °· You feel nauseous or you vomit. °· You suddenly feel light-headed or dizzy. °· Your heart begins to beat quickly, or it feels like it is skipping beats. °These symptoms may represent a serious problem that is an emergency. Do not wait to see if the symptoms will go away. Get medical help right away. Call your local emergency services (911 in the [place] Do not drive yourself to the hospital. °  °This  information is not intended to replace advice given to you by your health care provider. Make sure you discuss any questions you have with your health care provider. °  °Document Released: 10/27/2004 Document Revised: 02/07/2014 Document Reviewed: 08/23/2013 °Elsevier Interactive Patient Education ©2016 [employer] ° °

## 2015-04-22 NOTE — ED Notes (Signed)
Patient denies pain and is resting comfortably.  

## 2015-05-08 ENCOUNTER — Emergency Department
Admission: EM | Admit: 2015-05-08 | Discharge: 2015-05-08 | Disposition: A | Discharge status: Home / Self Care | Payer: Self-pay | Attending: Emergency Medicine | Admitting: Emergency Medicine

## 2015-05-08 ENCOUNTER — Encounter: Payer: Self-pay | Admitting: Emergency Medicine

## 2015-05-08 DIAGNOSIS — Z79899 Other long term (current) drug therapy: Secondary | ICD-10-CM | POA: Insufficient documentation

## 2015-05-08 DIAGNOSIS — R197 Diarrhea, unspecified: Secondary | ICD-10-CM | POA: Insufficient documentation

## 2015-05-08 DIAGNOSIS — F17210 Nicotine dependence, cigarettes, uncomplicated: Secondary | ICD-10-CM | POA: Insufficient documentation

## 2015-05-08 DIAGNOSIS — E119 Type 2 diabetes mellitus without complications: Secondary | ICD-10-CM | POA: Insufficient documentation

## 2015-05-08 DIAGNOSIS — R112 Nausea with vomiting, unspecified: Secondary | ICD-10-CM

## 2015-05-08 DIAGNOSIS — Z7984 Long term (current) use of oral hypoglycemic drugs: Secondary | ICD-10-CM | POA: Insufficient documentation

## 2015-05-08 LAB — URINALYSIS COMPLETE WITH MICROSCOPIC (ARMC ONLY)
Bilirubin Urine: NEGATIVE
Glucose, UA: 50 mg/dL — AB
HGB URINE DIPSTICK: NEGATIVE
Ketones, ur: NEGATIVE mg/dL
LEUKOCYTES UA: NEGATIVE
NITRITE: NEGATIVE
PH: 6 (ref 5.0–8.0)
PROTEIN: 100 mg/dL — AB
SPECIFIC GRAVITY, URINE: 1.025 (ref 1.005–1.030)
SQUAMOUS EPITHELIAL / LPF: NONE SEEN

## 2015-05-08 LAB — COMPREHENSIVE METABOLIC PANEL
ALBUMIN: 4.5 g/dL (ref 3.5–5.0)
ALT: 26 U/L (ref 17–63)
ANION GAP: 5 (ref 5–15)
AST: 22 U/L (ref 15–41)
Alkaline Phosphatase: 93 U/L (ref 38–126)
BUN: 8 mg/dL (ref 6–20)
CHLORIDE: 104 mmol/L (ref 101–111)
CO2: 26 mmol/L (ref 22–32)
Calcium: 8.9 mg/dL (ref 8.9–10.3)
Creatinine, Ser: 0.76 mg/dL (ref 0.61–1.24)
GFR calc Af Amer: >60 mL/min (ref >60)
Glucose, Bld: 164 mg/dL — ABNORMAL HIGH (ref 65–99)
POTASSIUM: 3.7 mmol/L (ref 3.5–5.1)
Sodium: 135 mmol/L (ref 135–145)
Total Bilirubin: 1.4 mg/dL — ABNORMAL HIGH (ref 0.3–1.2)
Total Protein: 7.6 g/dL (ref 6.5–8.1)

## 2015-05-08 LAB — CBC
HEMATOCRIT: 49.3 % (ref 40.0–52.0)
HEMOGLOBIN: 16.9 g/dL (ref 13.0–18.0)
MCH: 33 pg (ref 26.0–34.0)
MCHC: 34.2 g/dL (ref 32.0–36.0)
MCV: 96.3 fL (ref 80.0–100.0)
Platelets: 210 10*3/uL (ref 150–440)
RBC: 5.12 MIL/uL (ref 4.40–5.90)
RDW: 12.4 % (ref 11.5–14.5)
WBC: 8 10*3/uL (ref 3.8–10.6)

## 2015-05-08 LAB — LIPASE, BLOOD: LIPASE: 21 U/L (ref 11–51)

## 2015-05-08 MED ORDER — ONDANSETRON 4 MG PO TBDP
ORAL_TABLET | ORAL | Status: AC
  Filled 2015-05-08: qty 1

## 2015-05-08 MED ORDER — ONDANSETRON 4 MG PO TBDP
4.0000 mg | ORAL_TABLET | Freq: Once | ORAL | Status: AC
  Administered 2015-05-08: 4 mg via ORAL

## 2015-05-08 MED ORDER — ONDANSETRON HCL 4 MG PO TABS: 4.0000 mg | ORAL_TABLET | Freq: Three times a day (TID) | ORAL | Status: AC | PRN

## 2015-05-08 NOTE — ED Notes (Signed)
Patient states abdominal pain started on Wednesday night after eating [employer] shrimp and fish sandwich and since then pain has been present.  Reports diarrhea since Wednesday, 3x today.  Vomited x1 today.  Abdominal pain in navel region.

## 2015-05-08 NOTE — ED Provider Notes (Signed)
[facility] Emergency Department Provider Note   ____________________________________________  Time seen: Approximately 6:15 PM I have reviewed the triage vital signs and the triage nursing note.  HISTORY  Chief Complaint Abdominal Pain   Historian Patient  HPI Angel Mitchell is a 45 y.o. male with a history of cholecystectomy, diabetes, and alcohol abuse, is here with vomiting and nausea and diarrhea for about 3 days. He thinks everything started after [employer] shrimp and fish sandwich,but friend did not have similar symptoms.  No fever. No black or bloody stools. No bloody emesis. He states he has had decreased by mouth intake since then. No known sick contacts.  Eating seems to make it worse. Nothing really makes better.  He states is also had nasal congestion/sinus problems.   Past Medical History  Diagnosis Date  . Diabetes mellitus without complication (HCC)     There are no active problems to display for this patient.   Past Surgical History  Procedure Laterality Date  . Cholecystectomy      Current Outpatient Rx  Name  Route  Sig  Dispense  Refill  . metFORMIN (GLUCOPHAGE) 500 MG tablet   Oral   Take 500 mg by mouth 2 (two) times daily with a meal.         . ondansetron (ZOFRAN ODT) 4 MG disintegrating tablet   Oral   Take 1 tablet (4 mg total) by mouth every 8 (eight) hours as needed for nausea or vomiting. Patient not taking: Reported on 04/22/2015   20 tablet   0   . ondansetron (ZOFRAN) 4 MG tablet   Oral   Take 1 tablet (4 mg total) by mouth every 8 (eight) hours as needed for nausea or vomiting.   5 tablet   0   . traMADol (ULTRAM) 50 MG tablet   Oral   Take 1 tablet (50 mg total) by mouth every 6 (six) hours as needed. Patient not taking: Reported on 04/22/2015   10 tablet   0     Allergies Review of patient's allergies indicates no known allergies.  History reviewed. No pertinent family history.  Social  History Social History  Substance Use Topics  . Smoking status: Current Every Day Smoker -- 0.50 packs/day    Types: Cigarettes  . Smokeless tobacco: None  . Alcohol Use: 1.2 oz/week    2 Cans of beer per week     Comment: daily    Review of Systems  Constitutional: Negative for fever. Eyes: Negative for visual changes. ENT: Negative for sore throat. Cardiovascular: Negative for chest pain. Recently evaluated for chest pain, but did not follow-up with a cardiologist yet. Respiratory: Negative for shortness of breath. Gastrointestinal: Some abdominal soreness. Genitourinary: Negative for dysuria. Musculoskeletal: Negative for back pain. Skin: Negative for rash. Neurological: Negative for headache. 10 point Review of Systems otherwise negative ____________________________________________   PHYSICAL EXAM:  VITAL SIGNS: ED Triage Vitals  Enc Vitals Group     BP 05/08/15 1652 163/96 mmHg     Pulse Rate 05/08/15 1652 71     Resp 05/08/15 1652 18     Temp 05/08/15 1652 98.2 F (36.8 C)     Temp Source 05/08/15 1652 Oral     SpO2 05/08/15 1652 100 %     Weight 05/08/15 1652 180 lb (81.647 kg)     Height 05/08/15 1652  (1.727 m)     Head Cir --      Peak Flow --  Pain Score 05/08/15 1701 6     Pain Loc --      Pain Edu? --      Excl. in GC? --      Constitutional: Alert and oriented. Well appearing and in no distress. HEENT   Head: Normocephalic and atraumatic.      Eyes: Conjunctivae are normal. PERRL. Normal extraocular movements.      Ears:         Nose: No congestion/rhinnorhea.   Mouth/Throat: Mucous membranes are moist.   Neck: No stridor. Cardiovascular/Chest: Normal rate, regular rhythm.  No murmurs, rubs, or gallops. Respiratory: Normal respiratory effort without tachypnea nor retractions. Breath sounds are clear and equal bilaterally. No wheezes/rales/rhonchi. Gastrointestinal: Soft. No distention, no guarding, no rebound. Mildly tender  in 4 quadrants without any focal tenderness, or masses.  Genitourinary/rectal:Deferred Musculoskeletal: Nontender with normal range of motion in all extremities. No joint effusions.  No lower extremity tenderness.  No edema. Neurologic:  Normal speech and language. No gross or focal neurologic deficits are appreciated. Skin:  Skin is warm, dry and intact. No rash noted. Psychiatric: Mood and affect are normal. Speech and behavior are normal. Patient exhibits appropriate insight and judgment.  ____________________________________________   EKG I, Governor Rooksebecca Tobi Groesbeck, MD, the attending physician have personally viewed and interpreted all ECGs.  62 bpm. Normal sinus rhythm. Narrow QRS. LVH. Normal axis. No ST segment elevation, ST segment depression or T-wave inversion ____________________________________________  LABS (pertinent positives/negatives)  Lipase 21 Comprehensive metabolic panel without significant abnormalities CBC within normal limits Urinalysis rare bacteria, 0-5 white blood cells and 0-5 red blood cells, negative for nitrites, ketones, and leukocytes  ____________________________________________  RADIOLOGY All Xrays were viewed by me. Imaging interpreted by Radiologist.  None __________________________________________  PROCEDURES  Procedure(s) performed: None  Critical Care performed: None  ____________________________________________   ED COURSE / ASSESSMENT AND PLAN  Pertinent labs & imaging results that were available during my care of the patient were reviewed by me and considered in my medical decision making (see chart for details).   This patient's here with nausea vomiting diarrhea and is overall well-appearing. His laboratory studies are reassuring without evidence of pancreatitis, no abnormal elect lites, kidney dysfunction, nor elevated white blood cell count or anemia. His urinalysis shows no ketones. I don't think he needs IV fluids. I am going to  treat him symptomatically with ODT Zofran. No urinary symptoms, I will send a urine culture Patient was complaining of some sinus congestion, but without fever, green discharge or length of symptoms, I don't think there is an indication for antibiotic treatment.  I am most suspicious that the nausea vomiting and diarrhea is likely from a viral illness, as this is common in the community right now. He does not have any high risk/red flag features such as blood or fever. At this point I'm going to ask him to follow-up with his private care physician. We discussed return precautions for the emergency department. He's had some generalized abdominal cramping diffusely, but his abdomen is nonfocal, and the pain seems relatively mild, and with reassuring laboratory studies, I am less suspicious of an acute abdominal emergency and discussed risk versus benefit of obtaining CT scan, and recommended against it. Patient is comfortable with this plan of management. We discussed return precautions for the emergency department.    CONSULTATIONS:   None   Patient / Family / Caregiver informed of clinical course, medical decision-making process, and agree with plan.   I discussed return precautions, follow-up  instructions, and discharged instructions with patient and/or family.   ___________________________________________   FINAL CLINICAL IMPRESSION(S) / ED DIAGNOSES   Final diagnoses:  Nausea vomiting and diarrhea              Note: This dictation was prepared with Dragon dictation. Any transcriptional errors that result from this process are unintentional   Governor Rooks, MD 05/08/15 [phone]

## 2015-05-08 NOTE — ED Notes (Signed)
Pt states he drinks alcohol every day and he has v/d for 3 days after eating shrimp and fish.  Pt states my stomach hurts.  No back pain.  Pt alert.

## 2015-05-08 NOTE — Discharge Instructions (Signed)
You were evaluated for nausea vomiting and diarrhea, and your exam and evaluation are reassuring. We discussed holding off on CT scan today given your reassuring exam and evaluation.  Return to the emergency department for any new or worsening condition including vomiting blood, black or bloody stools, dizziness or passing out, fever, or abdominal pain.   Nausea and Vomiting Nausea means you feel sick to your stomach. Throwing up (vomiting) is a reflex where stomach contents come out of your mouth. HOME CARE   Take medicine as told by your doctor.  Do not force yourself to eat. However, you do need to drink fluids.  If you feel like eating, eat a normal diet as told by your doctor.  Eat rice, wheat, potatoes, bread, lean meats, yogurt, fruits, and vegetables.  Avoid high-fat foods.  Drink enough fluids to keep your pee (urine) clear or pale yellow.  Ask your doctor how to replace body fluid losses (rehydrate). Signs of body fluid loss (dehydration) include:  Feeling very thirsty.  Dry lips and mouth.  Feeling dizzy.  Dark pee.  Peeing less than normal.  Feeling confused.  Fast breathing or heart rate. GET HELP RIGHT AWAY IF:   You have blood in your throw up.  You have black or bloody poop (stool).  You have a bad headache or stiff neck.  You feel confused.  You have bad belly (abdominal) pain.  You have chest pain or trouble breathing.  You do not pee at least once every 8 hours.  You have cold, clammy skin.  You keep throwing up after 24 to 48 hours.  You have a fever. MAKE SURE YOU:   Understand these instructions.  Will watch your condition.  Will get help right away if you are not doing well or get worse.   This information is not intended to replace advice given to you by your health care provider. Make sure you discuss any questions you have with your health care provider.   Document Released: 07/06/2007 Document Revised: 04/11/2011 Document  Reviewed: 06/18/2010 Elsevier Interactive Patient Education 2016 [employer]  Diarrhea Diarrhea is watery poop (stool). It can make you feel weak, tired, thirsty, or give you a dry mouth (signs of dehydration). Watery poop is a sign of another problem, most often an infection. It often lasts 2-3 days. It can last longer if it is a sign of something serious. Take care of yourself as told by your doctor. HOME CARE   Drink 1 cup (8 ounces) of fluid each time you have watery poop.  Do not drink the following fluids:  Those that contain simple sugars (fructose, glucose, galactose, lactose, sucrose, maltose).  Sports drinks.  Fruit juices.  Whole milk products.  Sodas.  Drinks with caffeine (coffee, tea, soda) or alcohol.  Oral rehydration solution may be used if the doctor says it is okay. You may make your own solution. Follow this recipe:   - teaspoon table salt.   teaspoon baking soda.   teaspoon salt substitute containing potassium chloride.  1 tablespoons sugar.  1 liter (34 ounces) of water.  Avoid the following foods:  High fiber foods, such as raw fruits and vegetables.  Nuts, seeds, and whole grain breads and cereals.   Those that are sweetened with sugar alcohols (xylitol, sorbitol, mannitol).  Try eating the following foods:  Starchy foods, such as rice, toast, pasta, low-sugar cereal, oatmeal, baked potatoes, crackers, and bagels.  Bananas.  Applesauce.  Eat probiotic-rich foods, such as yogurt  and milk products that are fermented.  Wash your hands well after each time you have watery poop.  Only take medicine as told by your doctor.  Take a warm bath to help lessen burning or pain from having watery poop. GET HELP RIGHT AWAY IF:   You cannot drink fluids without throwing up (vomiting).  You keep throwing up.  You have blood in your poop, or your poop looks black and tarry.  You do not pee (urinate) in 6-8 hours, or there is only a small  amount of very dark pee.  You have belly (abdominal) pain that gets worse or stays in the same spot (localizes).  You are weak, dizzy, confused, or light-headed.  You have a very bad headache.  Your watery poop gets worse or does not get better.  You have a fever or lasting symptoms for more than 2-3 days.  You have a fever and your symptoms suddenly get worse. MAKE SURE YOU:   Understand these instructions.  Will watch your condition.  Will get help right away if you are not doing well or get worse.   This information is not intended to replace advice given to you by your health care provider. Make sure you discuss any questions you have with your health care provider.   Document Released: 07/06/2007 Document Revised: 02/07/2014 Document Reviewed: 09/25/2011 Elsevier Interactive Patient Education [employer] Else[employer] Inc.

## 2015-08-10 ENCOUNTER — Encounter: Payer: Self-pay | Admitting: Emergency Medicine

## 2015-08-10 ENCOUNTER — Emergency Department
Admission: EM | Admit: 2015-08-10 | Discharge: 2015-08-10 | Disposition: A | Discharge status: Home / Self Care | Payer: Self-pay | Attending: Emergency Medicine | Admitting: Emergency Medicine

## 2015-08-10 DIAGNOSIS — J0101 Acute recurrent maxillary sinusitis: Secondary | ICD-10-CM | POA: Insufficient documentation

## 2015-08-10 DIAGNOSIS — F17210 Nicotine dependence, cigarettes, uncomplicated: Secondary | ICD-10-CM | POA: Insufficient documentation

## 2015-08-10 DIAGNOSIS — R109 Unspecified abdominal pain: Secondary | ICD-10-CM | POA: Insufficient documentation

## 2015-08-10 DIAGNOSIS — E119 Type 2 diabetes mellitus without complications: Secondary | ICD-10-CM | POA: Insufficient documentation

## 2015-08-10 LAB — CBC
HEMATOCRIT: 51.9 % (ref 40.0–52.0)
Hemoglobin: 18.1 g/dL — ABNORMAL HIGH (ref 13.0–18.0)
MCH: 34.3 pg — ABNORMAL HIGH (ref 26.0–34.0)
MCHC: 34.8 g/dL (ref 32.0–36.0)
MCV: 98.5 fL (ref 80.0–100.0)
PLATELETS: 242 10*3/uL (ref 150–440)
RBC: 5.27 MIL/uL (ref 4.40–5.90)
RDW: 12 % (ref 11.5–14.5)
WBC: 7.8 10*3/uL (ref 3.8–10.6)

## 2015-08-10 LAB — URINALYSIS COMPLETE WITH MICROSCOPIC (ARMC ONLY)
Bilirubin Urine: NEGATIVE
Hgb urine dipstick: NEGATIVE
Leukocytes, UA: NEGATIVE
NITRITE: NEGATIVE
Protein, ur: 30 mg/dL — AB
SPECIFIC GRAVITY, URINE: 1.029 (ref 1.005–1.030)
SQUAMOUS EPITHELIAL / LPF: NONE SEEN
pH: 5 (ref 5.0–8.0)

## 2015-08-10 LAB — COMPREHENSIVE METABOLIC PANEL
ALK PHOS: 105 U/L (ref 38–126)
ALT: 16 U/L — ABNORMAL LOW (ref 17–63)
ANION GAP: 9 (ref 5–15)
AST: 18 U/L (ref 15–41)
Albumin: 4.6 g/dL (ref 3.5–5.0)
BUN: 10 mg/dL (ref 6–20)
CALCIUM: 9.5 mg/dL (ref 8.9–10.3)
CO2: 25 mmol/L (ref 22–32)
Chloride: 102 mmol/L (ref 101–111)
Creatinine, Ser: 0.81 mg/dL (ref 0.61–1.24)
GFR calc non Af Amer: >60 mL/min (ref >60)
Glucose, Bld: 225 mg/dL — ABNORMAL HIGH (ref 65–99)
POTASSIUM: 4.1 mmol/L (ref 3.5–5.1)
SODIUM: 136 mmol/L (ref 135–145)
TOTAL PROTEIN: 7.9 g/dL (ref 6.5–8.1)
Total Bilirubin: 1 mg/dL (ref 0.3–1.2)

## 2015-08-10 LAB — LIPASE, BLOOD: Lipase: 29 U/L (ref 11–51)

## 2015-08-10 MED ORDER — OXYMETAZOLINE HCL 0.05 % NA SOLN
1.0000 | Freq: Once | NASAL | Status: AC
  Administered 2015-08-10: 1 via NASAL
  Filled 2015-08-10: qty 15

## 2015-08-10 MED ORDER — FLUTICASONE PROPIONATE 50 MCG/ACT NA SUSP
1.0000 | Freq: Every day | NASAL | Status: AC
Start: 2015-08-10 — End: 2016-08-09

## 2015-08-10 MED ORDER — AMOXICILLIN-POT CLAVULANATE 875-125 MG PO TABS: 1.0000 | ORAL_TABLET | Freq: Two times a day (BID) | ORAL | Status: AC

## 2015-08-10 MED ORDER — METFORMIN HCL 500 MG PO TABS: 500.0000 mg | ORAL_TABLET | Freq: Two times a day (BID) | ORAL | Status: AC

## 2015-08-10 NOTE — ED Notes (Signed)
States sinus congestion and drainage x 4 days. Also states abdominal pain, nausea and diarrhea.

## 2015-08-10 NOTE — ED Provider Notes (Signed)
[facility] Emergency Department Provider Note  Time seen: 4:14 PM  I have reviewed the triage vital signs and the nursing notes.   HISTORY  Chief Complaint Sinusitis and Abdominal Pain    HPI Angel Mitchell is a 45 y.o. male with a past medical history of diabetes who presents the emergency department with sinus congestion and abdominal cramping. According to the patient is recently started on metformin by myself in the emergency department during his last visit. Patient states he has not yet followed up with a primary care doctor, he ran out of his metformin several days ago. Patient also states for the past 4 days he has been having significant sinus congestion and pressure. Patient describes the sinus congestion and pressure as moderate, pressure sensation to both signs above and below his eyes. He describes abdominal cramping as moderate, cramping sensation mostly located to the upper abdomen, has been having occasional loose stools.     Past Medical History  Diagnosis Date  . Diabetes mellitus without complication (HCC)     There are no active problems to display for this patient.   Past Surgical History  Procedure Laterality Date  . Cholecystectomy      Current Outpatient Rx  Name  Route  Sig  Dispense  Refill  . metFORMIN (GLUCOPHAGE) 500 MG tablet   Oral   Take 500 mg by mouth 2 (two) times daily with a meal.         . ondansetron (ZOFRAN ODT) 4 MG disintegrating tablet   Oral   Take 1 tablet (4 mg total) by mouth every 8 (eight) hours as needed for nausea or vomiting. Patient not taking: Reported on 04/22/2015   20 tablet   0   . ondansetron (ZOFRAN) 4 MG tablet   Oral   Take 1 tablet (4 mg total) by mouth every 8 (eight) hours as needed for nausea or vomiting.   5 tablet   0   . traMADol (ULTRAM) 50 MG tablet   Oral   Take 1 tablet (50 mg total) by mouth every 6 (six) hours as needed. Patient not taking: Reported on 04/22/2015  10 tablet   0     Allergies Review of patient's allergies indicates no known allergies.  No family history on file.  Social History Social History  Substance Use Topics  . Smoking status: Current Every Day Smoker -- 0.50 packs/day    Types: Cigarettes  . Smokeless tobacco: None  . Alcohol Use: 1.2 oz/week    2 Cans of beer per week     Comment: daily    Review of Systems Constitutional: Negative for fever.Positive for sinus congestion. Cardiovascular: Negative for chest pain. Respiratory: Negative for shortness of breath. Gastrointestinal: Mild to moderate abdominal cramping Musculoskeletal: Negative for back pain. Neurological: Negative for headache 10-point ROS otherwise negative.  ____________________________________________   PHYSICAL EXAM:  VITAL SIGNS: ED Triage Vitals  Enc Vitals Group     BP 08/10/15 1456 140/95 mmHg     Pulse Rate 08/10/15 1456 75     Resp 08/10/15 1456 18     Temp 08/10/15 1456 97.6 F (36.4 C)     Temp Source 08/10/15 1456 Oral     SpO2 08/10/15 1456 98 %     Weight 08/10/15 1456 180 lb (81.647 kg)     Height 08/10/15 1456 5\' 8"  (1.727 m)     Head Cir --      Peak Flow --  Pain Score 08/10/15 1458 6     Pain Loc --      Pain Edu? --      Excl. in GC? --    Constitutional: Alert and oriented. Well appearing and in no distress. Eyes: Normal exam ENT   Head: Normocephalic and atraumatic.Moderate tenderness to percussion over bilateral maxillary and frontal sinuses.   Mouth/Throat: Mucous membranes are moist. Cardiovascular: Normal rate, regular rhythm Respiratory: Normal respiratory effort without tachypnea nor retractions. Breath sounds are clear and equal bilaterally. No wheezes/rales/rhonchi. Gastrointestinal: Soft and nontender. No distention.  Musculoskeletal: Nontender with normal range of motion in all extremities.  Neurologic:  Normal speech and language. No gross focal neurologic deficits Skin:  Skin is warm,  dry and intact.  Psychiatric: Mood and affect are normal.   ____________________________________________   INITIAL IMPRESSION / ASSESSMENT AND PLAN / ED COURSE  Pertinent labs & imaging results that were available during my care of the patient were reviewed by me and considered in my medical decision making (see chart for details).  The patient presents the emergency department with sinus pressure and congestion. He also ran out of his metformin several days ago and does not have a primary care doctor. On exam patient does have sinus tenderness to percussion consistent with sinusitis. We will discharge with a short course of antibiotics and Flonase. I will refill the patient's metformin. I will refer the patient to [facility]. Patient is agreeable to plan. Overall the patient's labs are largely within normal limits besides a mildly elevated glucose of 225. The abdominal cramping and occasional loose stool is likely related to metformin use. He states the cramping was much worse when he was taking 1000 mg twice a day so he decreased his dose to 500 mg twice a day in the abdominal cramping has been much improved.  ____________________________________________   FINAL CLINICAL IMPRESSION(S) / ED DIAGNOSES  Sinusitis Abdominal cramping   Minna Antis, MD 08/10/15 [phone]

## 2015-08-10 NOTE — Discharge Instructions (Signed)

## 2015-12-14 ENCOUNTER — Telehealth: Payer: Self-pay | Admitting: Family Medicine

## 2015-12-14 NOTE — Telephone Encounter (Signed)
Patient walked into the office wanting to know if you will take him back as a patient.  He had moved out of town and is now back.  I can't find any records in [place] on him so it Maryjane Hurtermustve been years ago when he was a patient of yours.

## 2015-12-14 NOTE — Telephone Encounter (Signed)
OK to re-establish 

## 2015-12-14 NOTE — Telephone Encounter (Signed)
LMTCB. Thanks TNP °

## 2015-12-14 NOTE — Telephone Encounter (Signed)
Pt returned call and scheduled re-establish appt on 01/15/16 @ 3 pm. Thanks TNP

## 2016-01-15 ENCOUNTER — Ambulatory Visit: Payer: Self-pay | Admitting: Family Medicine

## 2016-09-01 IMAGING — CT CT ABD-PELV W/ CM
1 of 3 series · 14 of 32 positions shown, 19 images · IV contrast (omnipaque)
Comparison: Ultrasound 12/04/2006.  CT 07/06/2007 report only.

CLINICAL DATA: Abdominal cramping with nausea, vomiting and
diarrhea for 3 days. History of colitis and cholecystectomy.

EXAM:
CT ABDOMEN AND PELVIS WITH CONTRAST
TECHNIQUE: Multidetector CT imaging of the abdomen and pelvis was performed
using the standard protocol following bolus administration of
intravenous contrast.
CONTRAST:  100mL OMNIPAQUE IOHEXOL 300 MG/ML  SOLN

[Series 2: routine abd pel with · axial · 0.72mm/px · z∈[-502,-52]mm · 14 of 102 slices shown, 19 images]
[im 6/102  soft-tissue]
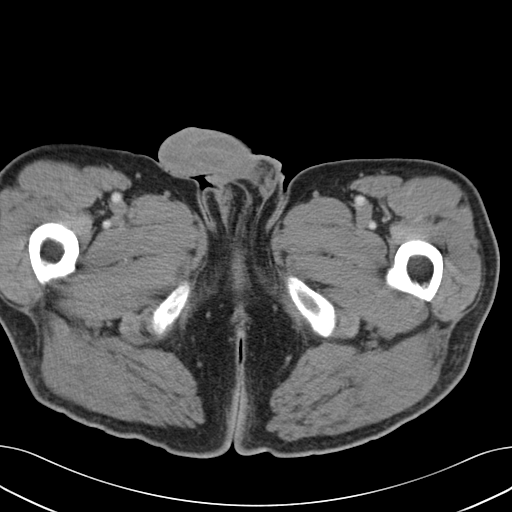
[im 6/102  bone]
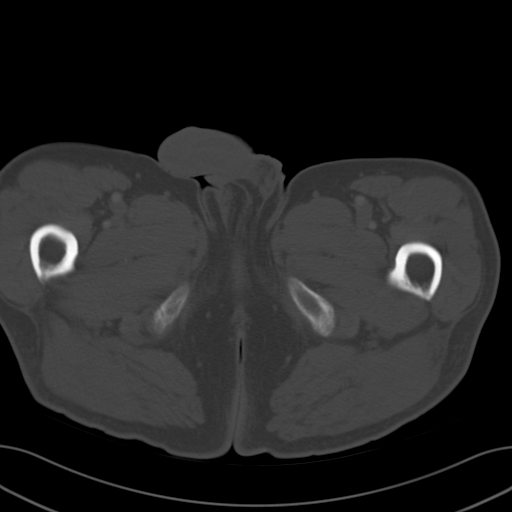
[im 12/102  soft-tissue]
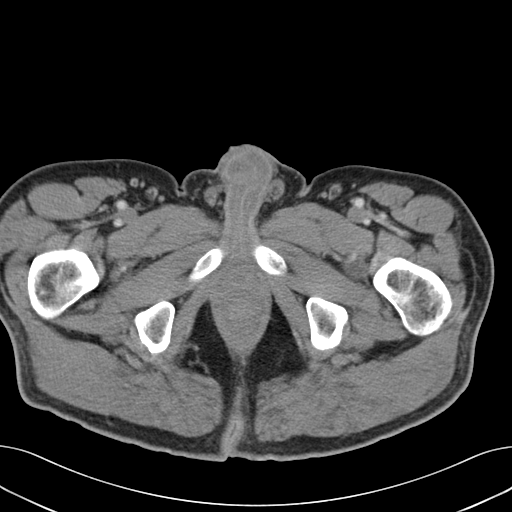
[im 23/102  soft-tissue]
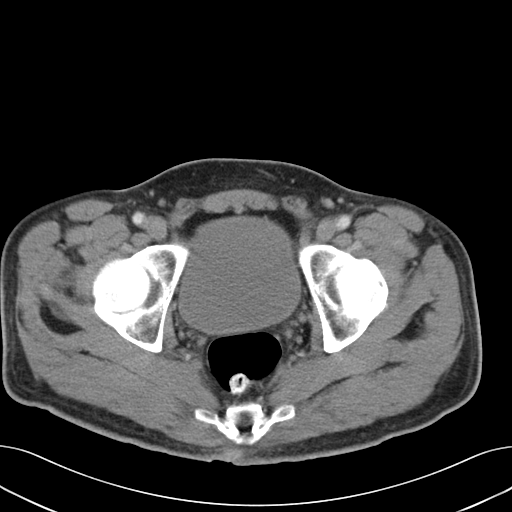
[im 29/102  soft-tissue]
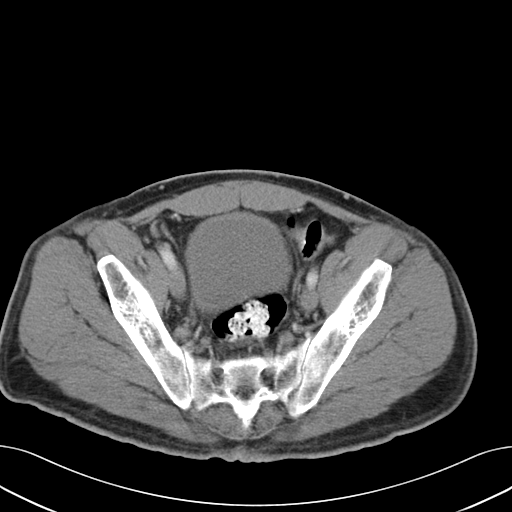
[im 34/102  soft-tissue]
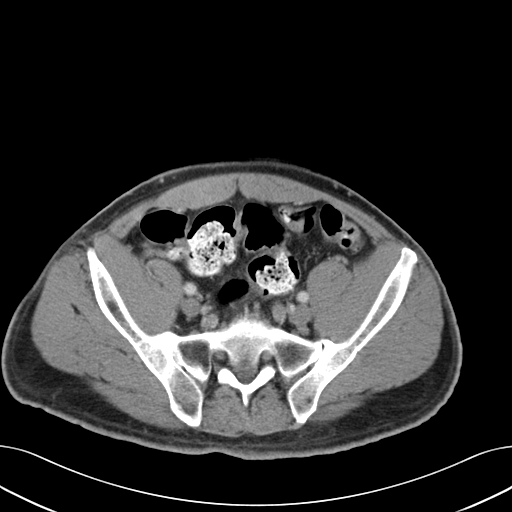
[im 45/102  soft-tissue]
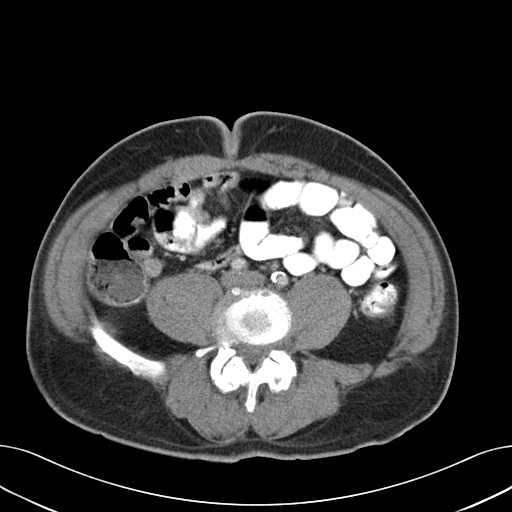
[im 51/102  soft-tissue]
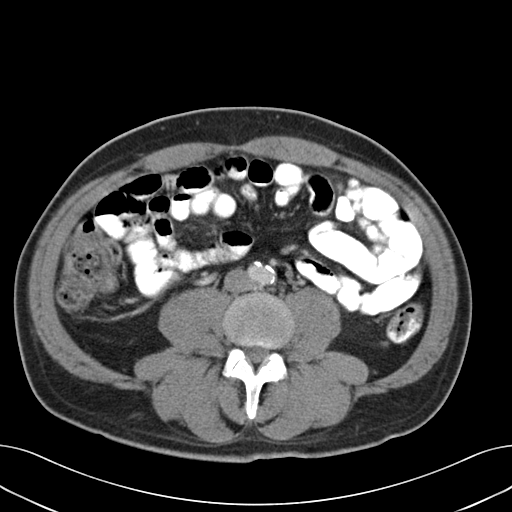
[im 57/102  soft-tissue]
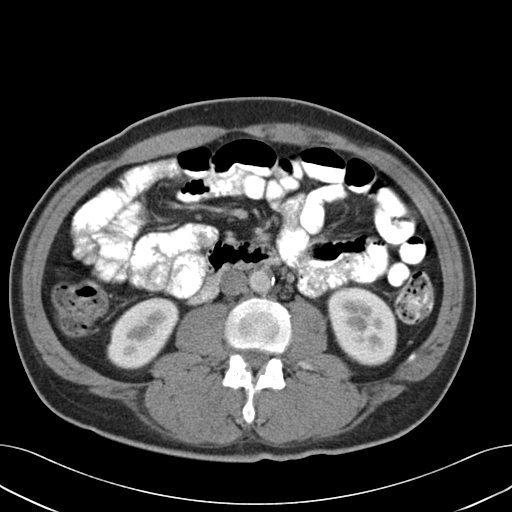
[im 68/102  soft-tissue]
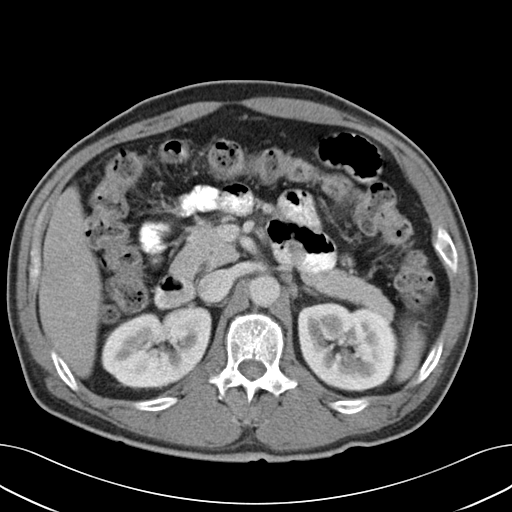
[im 68/102  bone]
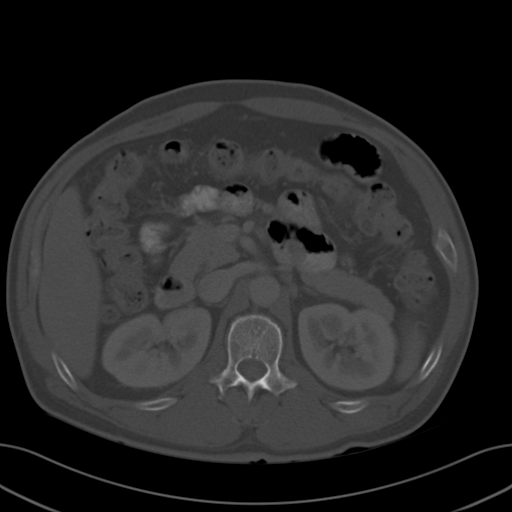
[im 73/102  soft-tissue]
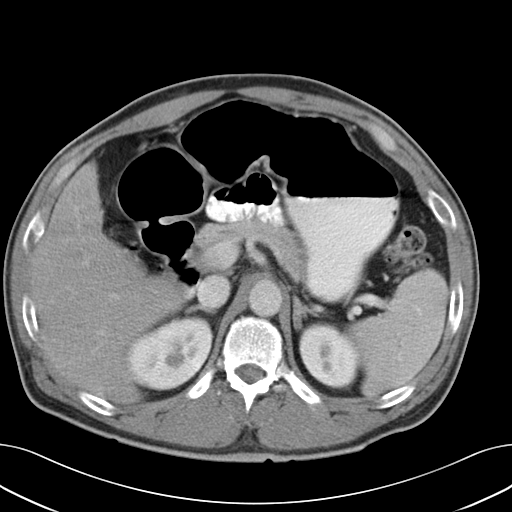
[im 79/102  soft-tissue]
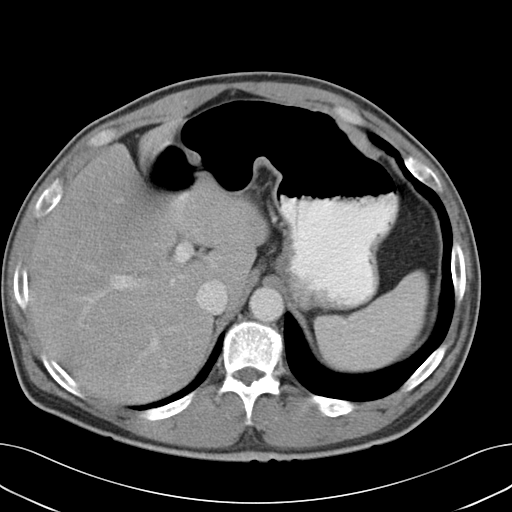
[im 79/102  lung]
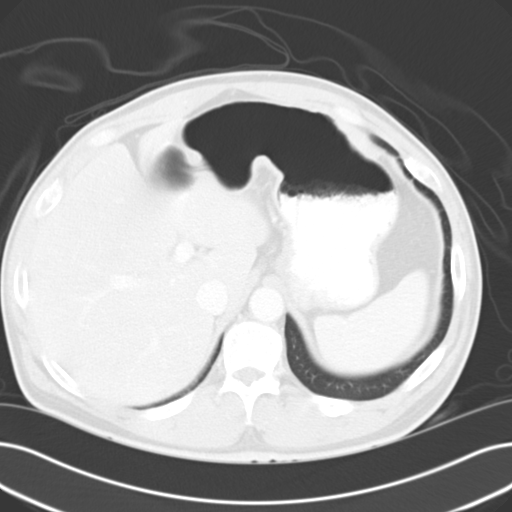
[im 85/102  lung]
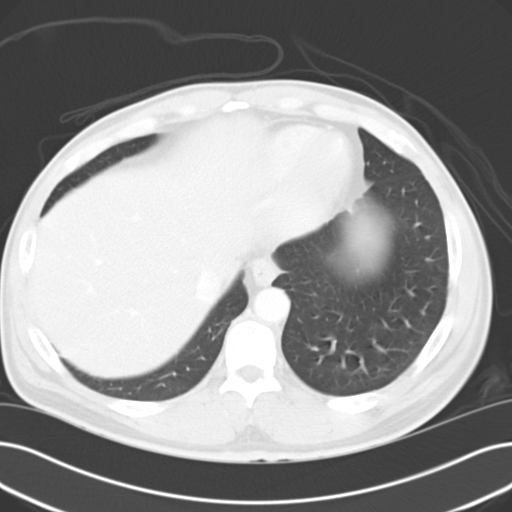
[im 90/102  soft-tissue]
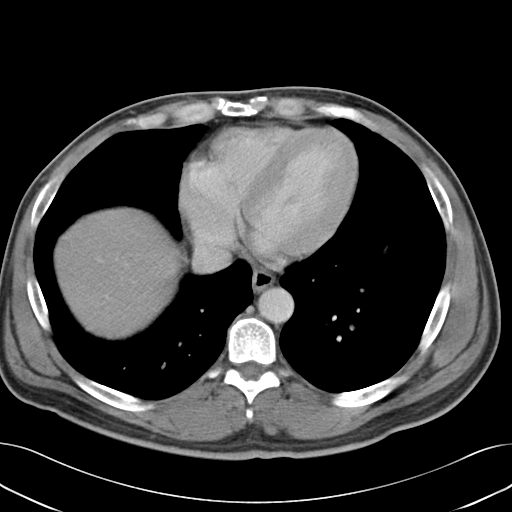
[im 90/102  lung]
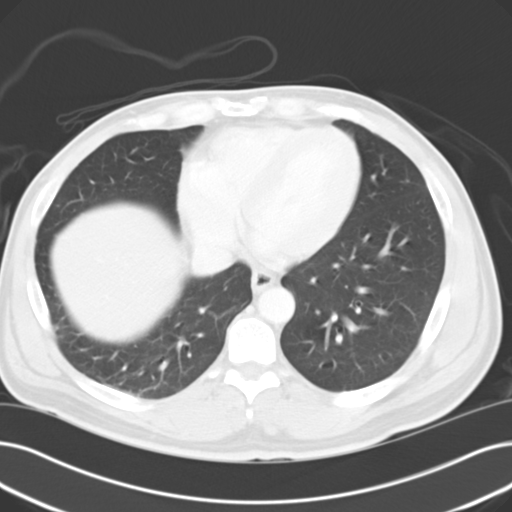
[im 96/102  soft-tissue]
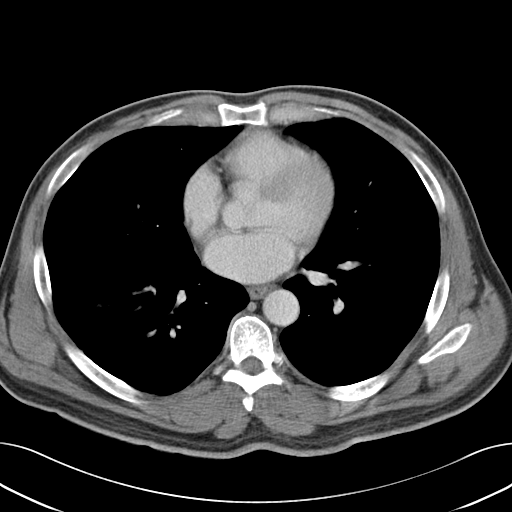
[im 96/102  lung]
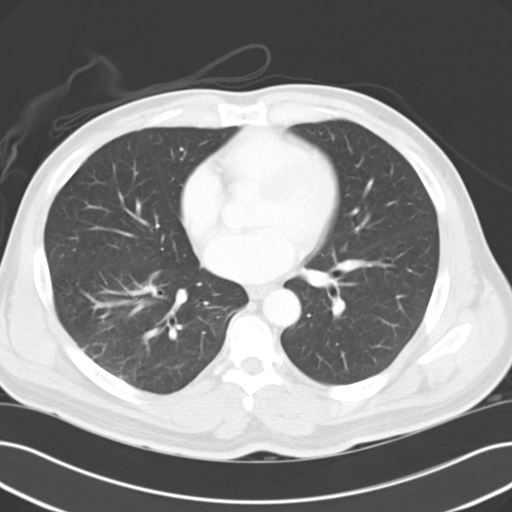

[14 of 32 positions shown; findings below may reference images not displayed]

FINDINGS: Lower chest: Mild atelectasis at the right lung base. There is no
confluent airspace opacity, pleural or pericardial effusion. A small
hiatal hernia is noted.

Hepatobiliary: The liver is normal in density without focal
abnormality. No significant biliary dilatation status post interval
cholecystectomy.

Pancreas: Unremarkable. No pancreatic ductal dilatation or
surrounding inflammatory changes.

Spleen: Normal in size without focal abnormality. There is a small
splenule.

Adrenals/Urinary Tract: Both adrenal glands appear normal. The
kidneys appear normal without evidence of urinary tract calculus,
suspicious lesion or hydronephrosis. No bladder abnormalities are
seen.

Stomach/Bowel: No evidence of bowel wall thickening, distention or
surrounding inflammatory change. The appendix is not clearly seen.
There is no evidence of pericecal inflammation.

Vascular/Lymphatic: There are no enlarged abdominal or pelvic lymph
nodes. There is age advanced aortoiliac atherosclerosis.

Reproductive: Unremarkable.

Other: No evidence of abdominal wall mass or hernia.

Musculoskeletal: No acute or significant osseous findings. Lower
lumbar spine degenerative changes noted.
IMPRESSION: 1. No acute findings or explanation for the patient's symptoms.
2. Age advanced aortoiliac atherosclerosis.
3. Lower lumbar spondylosis.
